# Patient Record
Sex: Female | Born: 2000 | Race: White | Hispanic: No | Marital: Single | Smoking: Never smoker
Health system: Southern US, Community
[De-identification: ages and names within clinical notes are randomized; demographics above are authoritative.]

## PROBLEM LIST (undated history)

## (undated) DIAGNOSIS — J45909 Unspecified asthma, uncomplicated: Secondary | ICD-10-CM

## (undated) DIAGNOSIS — J302 Other seasonal allergic rhinitis: Secondary | ICD-10-CM

## (undated) DIAGNOSIS — J039 Acute tonsillitis, unspecified: Secondary | ICD-10-CM

## (undated) HISTORY — PX: WISDOM TOOTH EXTRACTION: SHX21

## (undated) HISTORY — PX: TONSILLECTOMY: SUR1361

---
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
## 2022-02-08 ENCOUNTER — Other Ambulatory Visit (HOSPITAL_BASED_OUTPATIENT_CLINIC_OR_DEPARTMENT_OTHER): Payer: Self-pay | Admitting: Orthopaedic Surgery

## 2022-02-08 DIAGNOSIS — M84363A Stress fracture, right fibula, initial encounter for fracture: Secondary | ICD-10-CM

## 2022-02-25 ENCOUNTER — Ambulatory Visit (INDEPENDENT_AMBULATORY_CARE_PROVIDER_SITE_OTHER): Payer: PRIVATE HEALTH INSURANCE | Admitting: Podiatry

## 2022-02-25 ENCOUNTER — Ambulatory Visit (INDEPENDENT_AMBULATORY_CARE_PROVIDER_SITE_OTHER): Payer: PRIVATE HEALTH INSURANCE

## 2022-02-25 DIAGNOSIS — M79671 Pain in right foot: Secondary | ICD-10-CM

## 2022-02-25 DIAGNOSIS — M79672 Pain in left foot: Secondary | ICD-10-CM | POA: Diagnosis not present

## 2022-02-25 DIAGNOSIS — S86899A Other injury of other muscle(s) and tendon(s) at lower leg level, unspecified leg, initial encounter: Secondary | ICD-10-CM

## 2022-02-25 NOTE — Progress Notes (Signed)
   Chief Complaint  Patient presents with   Foot Injury    needs orthotics / hx of stress fracture last yr in left foot    HPI: 21 y.o. female presenting today as a new patient referral from Dr. Huel Cote for custom molded orthotics fittings.  Patient states that she has a history of shinsplints with stress fracture.  She is a Chemical engineer at [employer], volleyball.  The season has just recently started and she has began noticing recurrence of her shinsplints and tibial pain.  She presents for further treatment and evaluation  No past medical history on file.  No past surgical history on file.  No Known Allergies   Physical Exam: General: The patient is alert and oriented x3 in no acute distress.  Dermatology: Skin is warm, dry and supple bilateral lower extremities. Negative for open lesions or macerations.  Vascular: Palpable pedal pulses bilaterally. Capillary refill within normal limits.  Negative for any significant edema or erythema  Neurological: Light touch and protective threshold grossly intact  Musculoskeletal Exam: No pedal deformities noted  Radiographic Exam:  Normal osseous mineralization. Joint spaces preserved. No fracture/dislocation/boney destruction.  Otherwise normal exam  Assessment: 1.  Medial tibial stress syndrome left   Plan of Care:  1. Patient evaluated. X-Rays reviewed.  2.  Patient was molded today for custom molded orthotics 3.  Recommend good supportive shoes and sneakers 4.  Recommend rest ice compression and elevation as needed 5.  Continue close management with athletic trainers and Dr. Steward Drone.  As always thank you for the referral 6.  Return to clinic as needed      Felecia Shelling, DPM [facility]  Dr. Felecia Shelling, DPM    [address] [place], [place] [address]                Office [phone]  Fax [phone]

## 2022-02-25 NOTE — Progress Notes (Signed)
Dg  

## 2022-03-14 ENCOUNTER — Encounter: Payer: Self-pay | Admitting: Sports Medicine

## 2022-03-14 NOTE — Progress Notes (Signed)
  [employer] Training Room Note  History of Present Illness:   Jeanette Crosby is a 21 y.o. female who is a member of the [employer] team.  She presents her training room today for sustained sport related concussion on 03/04/2022.  Patient collided with another player and then likely the floor when she had her head hit. Both her and her trainer discussed she was dazed briefly but no LOC.  She has had symptoms of headache, actually had an episode of vomiting the following day as well as some dizziness and ocular motor disturbances that lasted through the week.  She has advanced to the return to play protocol with her athletic trainer, today was her first full day of practice and she had absolutely no recurrence of symptoms.  Occasionally she will have a headache but does have a notable history of both migraines and headaches.  History of 1 prior concussion, freshman year; symptoms lasted about 3-5 days History of migraines and headaches -uses triptan as needed, Tylenol History of GAD - BuSpar as needed No ADHD or learning disability  *We did independently review her scat as well as her impact testing with athletic trainer today.  Her most recent impact testing from 3 days ago revealed lower than baseline symptom scores: Baseline 21, impact scoring 16.   Assessment and Plan:   1.) Sport-related concussion - DOI: 03/04/2022.  Symptoms have resolved, currently on stage 5 of protocol with first full practice today without recurrence of symptoms.  May advance through return to play protocol, okay for graded return to game play tomorrow if asymptomatic for 24 hours from stage 5. 2.)  History of headaches and migraine headaches - controlled.  May use Tylenol as needed.  Did discuss with her if she is needing Tylenol regularly greater than 1 week, present for reevaluation and training room.   Comprehensive Musculoskeletal Exam:    Gen: Well-appearing, in no acute distress; non-toxic Resp: Breathing  unlabored on room air; no wheezing. Psych: Fluid speech in conversation; appropriate affect; normal thought process Neuro: Sensation intact throughout. No gross coordination deficits.  Cranial nerves II-XII grossly intact. PERLA.  No recurrence of symptoms or nystagmus with VOMS testing today.   Imaging:    N/a  Madelyn Brunner, DO Primary Care Sports Medicine - [facility] Team Physician [facility]  This document was dictated using Dragon voice recognition software. A reasonable attempt at proof reading has been made to minimize errors.

## 2022-03-19 ENCOUNTER — Telehealth: Payer: Self-pay | Admitting: Podiatry

## 2022-03-19 NOTE — Telephone Encounter (Signed)
Lvm for pt to contact office to sched appointment to [place].

## 2022-04-08 ENCOUNTER — Ambulatory Visit: Payer: No Typology Code available for payment source | Admitting: *Deleted

## 2022-04-08 DIAGNOSIS — S86899A Other injury of other muscle(s) and tendon(s) at lower leg level, unspecified leg, initial encounter: Secondary | ICD-10-CM

## 2022-04-08 NOTE — Progress Notes (Signed)
Patient presents today to pick up custom molded foot orthotics recommended by Dr. Amalia Hailey.   Orthotics were dispensed and fit was satisfactory. Reviewed instructions for break-in and wear. Written instructions given to patient.  Patient will follow up by Thursday of this week after practicing in them tomorrow and Wednesday. Patient stated that she has an away game on Friday but will come by to have PPT added in office if she still feels like she needs more support. She stated when she put them on that it felt a little hard but she is going to break them in and will come back in if needed.    [facility] - order # [id number]

## 2022-05-02 ENCOUNTER — Encounter: Payer: Self-pay | Admitting: Sports Medicine

## 2022-05-02 NOTE — Progress Notes (Signed)
  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  About 2 weeks ago she was going for a ball where she dove forward and the right knee buckled inward in a valgus fashion.  She felt a pop on the inner aspect of the knee.  She did have some swelling but tried icing and taking over-the-counter anti-inflammatories.  The knee has not gotten better and continues to feel unstable.  She has been forced to play in a hinged knee brace for any stability.  Out of the brace she reports sensation of giving out and hesitancy to twist on the knee with medial sided instability.  Denies any prior injury to this knee.   Assessment and Plan:   Right medial knee injury -high suspicion for medial meniscus tear or other intra-articular pathology.  Knee effusion on exam today and reports of instability.  Positive McMurray's, Thessaly's test.  She does have some laxity with Lachman test, although this is also present in the contralateral knee.  We will proceed with MRI without contrast of the right knee.  She is to remain in the hinged knee brace.  Discussed abstaining from practice, may try to play with a hinged knee brace in place as her pain allows.  Prescription for indomethacin sent.  Comprehensive Musculoskeletal Exam:    Right knee: There is a small-moderate effusion of the right knee today.  Positive TTP over the medial joint line.  There is pain with valgus stress without giving way of the MCL.  Positive McMurray's test, hesitancy the positive test of the Thessaly's test on the medial aspect.  There is some laxity with anterior drawer, although this is equivocal of the other knee as well with some generalized hypermobility.  Imaging:    - will order MRI w/o contrast of R-knee  Elba Barman, DO [facility] [place]  This  document was dictated using Dragon voice recognition software. A reasonable attempt at proof reading has been made to minimize errors.

## 2022-05-03 ENCOUNTER — Encounter: Payer: Self-pay | Admitting: Sports Medicine

## 2022-05-07 ENCOUNTER — Other Ambulatory Visit: Payer: Self-pay | Admitting: Sports Medicine

## 2022-05-07 DIAGNOSIS — S83206A Unspecified tear of unspecified meniscus, current injury, right knee, initial encounter: Secondary | ICD-10-CM

## 2022-05-07 DIAGNOSIS — M25461 Effusion, right knee: Secondary | ICD-10-CM

## 2022-05-07 DIAGNOSIS — S8990XA Unspecified injury of unspecified lower leg, initial encounter: Secondary | ICD-10-CM

## 2022-05-07 NOTE — Progress Notes (Signed)
Re-ordered knee MRI right for meniscal and internal pathology.   Elba Barman, DO Primary Care Sports Medicine Physician  [place]  This note was dictated using Dragon naturally speaking software and may contain errors in syntax, spelling, or content which have not been identified prior to signing this note.

## 2022-05-11 ENCOUNTER — Ambulatory Visit
Admission: RE | Admit: 2022-05-11 | Discharge: 2022-05-11 | Disposition: A | Discharge status: Home / Self Care | Payer: PRIVATE HEALTH INSURANCE | Source: Ambulatory Visit | Attending: Sports Medicine | Admitting: Sports Medicine

## 2022-05-11 DIAGNOSIS — M25461 Effusion, right knee: Secondary | ICD-10-CM

## 2022-05-11 DIAGNOSIS — S8990XA Unspecified injury of unspecified lower leg, initial encounter: Secondary | ICD-10-CM

## 2022-05-11 DIAGNOSIS — S83206A Unspecified tear of unspecified meniscus, current injury, right knee, initial encounter: Secondary | ICD-10-CM

## 2022-05-17 ENCOUNTER — Encounter: Payer: Self-pay | Admitting: Sports Medicine

## 2022-05-17 NOTE — Progress Notes (Signed)
  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  It has been almost a month since she had her injury where she dove forward for a ball and the right knee buckled in an inward valgus fashion.  She did feel a pop on the inner aspect of the knee.  She has been in the hinged knee brace.  Still using crutches intermittently to walk as she does feel some instability on the medial side of the knee.  Swelling has been improving.  Currently on indomethacin BID which does help but she still has a decent amount of pain on the medial side.  Assessment and Plan:   Right knee injury Right knee effusion - improving  The patient's mechanism and physical exam were fairly suggestive of medial meniscal injury, however her MRI did not show any meniscal involvement.  She is about a month out from her injury and still feeling sensation of instability and pain.  I would like to have her see my partner, Dr. Steward Drone, for a second opinion.  I am curious if she possibly has an occult tear of the meniscus that flips up and down and was laying flat during static MRI.  Always possible this could have been an MCL sprain but she never had any giving way or instability with testing during my examinations.  She will continue in the hinged knee brace indomethacin until her follow-up with Dr. Steward Drone.  Comprehensive Musculoskeletal Exam:    Right knee: Trace effusion.  Positive TTP on the medial joint line and medial femoral condyle.  There is pain and an intermittent clicking with McMurray's testing.  Pain and hesitancy with Thessaly's testing on the medial side.  No varus or valgus instability.  She is somewhat lax with both knees, however no difference in Lachman or drawer testing of the affected knee (negative).   Imaging:    MR Knee Right w/o contrast CLINICAL DATA:  Injured knee  playing volleyball 4 weeks ago.  EXAM: MRI OF THE RIGHT KNEE WITHOUT CONTRAST  TECHNIQUE: Multiplanar, multisequence MR imaging of the knee was performed. No intravenous contrast was administered.  COMPARISON:  None Available.  FINDINGS: MENISCI  Medial meniscus:  Intact  Lateral meniscus:  Intact  LIGAMENTS  Cruciates:  Intact  Collaterals:  Intact  CARTILAGE  Patellofemoral:  Normal  Medial:  Normal  Lateral:  Normal  Joint: No joint effusion. Slight thickening and inflammation of the quadriceps fat pad.  Popliteal Fossa:  No popliteal mass or Baker's cyst.  Extensor Mechanism: The patella retinacular structures are intact and the quadriceps and patellar tendons are intact.  Bones: Small contusion involving the fibular head. No other significant bony findings.  Other: Unremarkable knee musculature.  IMPRESSION: 1. Intact ligamentous structures. 2. No meniscal tears and intact articular cartilage. 3. Small bone contusion involving the fibular head. 4. No joint effusion or Baker's cyst. 5. Slight thickening and inflammation of the quadriceps fat pad.  Electronically Signed   By: Rudie Meyer M.D.   On: 05/11/2022 11:31    Madelyn Brunner, DO Primary Care Sports Medicine - [facility] Team Physician [facility]  This document was dictated using Dragon voice recognition software. A reasonable attempt at proof reading has been made to minimize errors.

## 2022-05-20 ENCOUNTER — Encounter (HOSPITAL_BASED_OUTPATIENT_CLINIC_OR_DEPARTMENT_OTHER): Payer: Self-pay | Admitting: Orthopaedic Surgery

## 2022-05-20 ENCOUNTER — Other Ambulatory Visit: Payer: Self-pay | Admitting: Sports Medicine

## 2022-05-20 NOTE — Progress Notes (Signed)
History of Present Illness:   Jeanette Crosby is a 21 y.o. female presents today with persistent right knee pain.  Of note she was previously seen by Dr. Madelyn Brunner after a traumatic injury where she felt the knee buckle while going for a ball.  She is Teacher, English as a foreign language.  She is here today for further discussion and assessment of her MRI.  She overall continues to have pain and swelling in the knee.  Her pain is predominantly medial.   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  I described that overall I do not see a meniscal tear in the area of her medial knee pain.  I did discuss differential diagnosis for sources of her knee pain and popping.  I did discuss that Hoffa's fat pad irritation as well as a medial plica could cause this.  To that effect I recommended ultrasound-guided knee injection as a next step to assist with her swelling and allow her to rehab and strengthen the knee.  I have advised that I do not see any ligamentous injury with regard to the knee and as result I do believe she would be able to rehab this quite well.  Comprehensive Musculoskeletal Exam:    Tenderness palpation about the medial joint space.  Positive McMurray, negative Lachman and posterior drawer.  No laxity with varus or valgus stress.  No tenderness about the lateral joint line or lateral ligamentous complexes  Imaging:    MRI right knee: Trace swelling within the knee although otherwise within normal limits  Huel Cote, MD Attending Physician, Orthopedic Surgery  This document was dictated using Dragon voice recognition software. A reasonable attempt at proof reading has been made to minimize errors.

## 2022-05-27 ENCOUNTER — Other Ambulatory Visit: Payer: Self-pay | Admitting: Sports Medicine

## 2022-05-27 ENCOUNTER — Ambulatory Visit (INDEPENDENT_AMBULATORY_CARE_PROVIDER_SITE_OTHER): Payer: PRIVATE HEALTH INSURANCE | Admitting: Orthopaedic Surgery

## 2022-05-27 DIAGNOSIS — M25561 Pain in right knee: Secondary | ICD-10-CM | POA: Diagnosis not present

## 2022-05-27 DIAGNOSIS — S8990XA Unspecified injury of unspecified lower leg, initial encounter: Secondary | ICD-10-CM

## 2022-05-27 DIAGNOSIS — S8991XA Unspecified injury of right lower leg, initial encounter: Secondary | ICD-10-CM | POA: Diagnosis not present

## 2022-05-27 MED ORDER — TRIAMCINOLONE ACETONIDE 40 MG/ML IJ SUSP
80.0000 mg | INTRAMUSCULAR | Status: AC | PRN
  Administered 2022-05-27: 80 mg via INTRA_ARTICULAR

## 2022-05-27 MED ORDER — LIDOCAINE HCL 1 % IJ SOLN
4.0000 mL | INTRAMUSCULAR | Status: AC | PRN
  Administered 2022-05-27: 4 mL

## 2022-05-27 NOTE — Progress Notes (Signed)
History of Present Illness:   05/27/2022: Presents today for right knee injection.  Jeanette Crosby is a 21 y.o. female presents today with persistent right knee pain.  Of note she was previously seen by Dr. Madelyn Brunner after a traumatic injury where she felt the knee buckle while going for a ball.  She is Teacher, English as a foreign language.  She is here today for further discussion and assessment of her MRI.  She overall continues to have pain and swelling in the knee.  Her pain is predominantly medial.   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  I described that overall I do not see a meniscal tear in the area of her medial knee pain.  I did discuss differential diagnosis for sources of her knee pain and popping.  I did discuss that Hoffa's fat pad irritation as well as a medial plica could cause this.  To that effect I recommended ultrasound-guided knee injection as a next step to assist with her swelling and allow her to rehab and strengthen the knee.  I have advised that I do not see any ligamentous injury with regard to the knee and as result I do believe she would be able to rehab this quite well.    Procedure Note  Patient: Jeanette Crosby             Date of Birth: 2001-05-07           MRN: [id number]             Visit Date: 05/27/2022  Procedures: Visit Diagnoses: No diagnosis found.  Large Joint Inj: R knee on 05/27/2022 11:04 AM Indications: pain Details: 22 G 1.5 in needle, ultrasound-guided anterior approach  Arthrogram: No  Medications: 4 mL lidocaine 1 %; 80 mg triamcinolone acetonide 40 MG/ML Outcome: tolerated well, no immediate complications Procedure, treatment alternatives, risks and benefits explained, specific risks discussed. Consent was given by the patient. Immediately prior to procedure a time out was called to verify the correct patient, procedure, equipment, support staff and site/side  marked as required. Patient was prepped and draped in the usual sterile fashion.       Comprehensive Musculoskeletal Exam:    Tenderness palpation about the medial joint space.  Positive McMurray, negative Lachman and posterior drawer.  No laxity with varus or valgus stress.  No tenderness about the lateral joint line or lateral ligamentous complexes  Imaging:    MRI right knee: Trace swelling within the knee although otherwise within normal limits  Huel Cote, MD Attending Physician, Orthopedic Surgery  This document was dictated using Dragon voice recognition software. A reasonable attempt at proof reading has been made to minimize errors.

## 2022-06-03 ENCOUNTER — Other Ambulatory Visit: Payer: Self-pay | Admitting: Sports Medicine

## 2022-06-10 ENCOUNTER — Other Ambulatory Visit: Payer: Self-pay | Admitting: Sports Medicine

## 2022-06-17 ENCOUNTER — Other Ambulatory Visit: Payer: Self-pay | Admitting: Sports Medicine

## 2022-07-12 ENCOUNTER — Ambulatory Visit (HOSPITAL_BASED_OUTPATIENT_CLINIC_OR_DEPARTMENT_OTHER): Payer: Self-pay | Admitting: Orthopaedic Surgery

## 2022-07-12 ENCOUNTER — Ambulatory Visit (INDEPENDENT_AMBULATORY_CARE_PROVIDER_SITE_OTHER): Payer: PRIVATE HEALTH INSURANCE | Admitting: Orthopaedic Surgery

## 2022-07-12 DIAGNOSIS — S83261A Peripheral tear of lateral meniscus, current injury, right knee, initial encounter: Secondary | ICD-10-CM | POA: Diagnosis not present

## 2022-07-12 NOTE — Progress Notes (Signed)
History of Present Illness:   07/12/2022: Once today she did not get any relief from her right knee injection.  She is overall continuing to have pain and mechanical type symptoms with popping and clicking in a deep squat.  She does feel like her knee is continuing to buckle.  She is having a hard time strengthening of the knee.  She is experiencing medial as well as lateral joint line tenderness and pain  Jeanette Crosby is a 22 y.o. female presents today with persistent right knee pain.  Of note she was previously seen by Dr. Elba Barman after a traumatic injury where she felt the knee buckle while going for a ball.  She is Animator.  She is here today for further discussion and assessment of her MRI.  She overall continues to have pain and swelling in the knee.  Her pain is predominantly medial.   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  At this point she is having an extremely difficult time rehabbing with bruising of the knee and persistent pain.  I did discuss that on review of her MRI I do see a a posterior lateral minimally displaced meniscal horizontal cleavage tear which I do believe could be contributing to these findings.  I did discuss that ultimately diagnostic arthroscopy would be the only way to definitively diagnose this.  Overall she has trialed physical therapy and rehab for the last 6 weeks and is continuing to experience mechanical and painful symptoms.  At this time given the fact that she is not able to improve I do believe diagnostic arthroscopy with possible lateral meniscal repair is indicated.  I did discuss possible weightbearing restrictions following surgery if a repair is needed.  We did go over this in detail.  I did discuss alternative treatment options with her including continued quadricep strengthening as well as possible PRP injection.  She would like to defer this today  and keep PRP in her back pocket as needed.  Will plan to proceed with diagnostic arthroscopy  Plan for right knee diagnostic arthroscopy with possible lateral meniscal repair   Comprehensive Musculoskeletal Exam:    Tenderness palpation about the medial joint space as well as lateral.  Positive McMurray, negative Lachman and posterior drawer.  No laxity with varus or valgus stress.  No tenderness about the lateral joint line or lateral ligamentous complexes  Imaging:    MRI right knee: Trace swelling with a possible cleavage tear involving the posterior third lateral meniscus  Jeanette Mulders, MD Attending Physician, Orthopedic Surgery  This document was dictated using Dragon voice recognition software. A reasonable attempt at proof reading has been made to minimize errors.

## 2022-07-12 NOTE — H&P (View-Only) (Signed)
History of Present Illness:   07/12/2022: Once today she did not get any relief from her right knee injection.  She is overall continuing to have pain and mechanical type symptoms with popping and clicking in a deep squat.  She does feel like her knee is continuing to buckle.  She is having a hard time strengthening of the knee.  She is experiencing medial as well as lateral joint line tenderness and pain  Jeanette Crosby is a 21 y.o. female presents today with persistent right knee pain.  Of note she was previously seen by Dr. Dana Crosby after a traumatic injury where she felt the knee buckle while going for a ball.  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  She is here today for further discussion and assessment of her MRI.  She overall continues to have pain and swelling in the knee.  Her pain is predominantly medial.   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  At this point she is having an extremely difficult time rehabbing with bruising of the knee and persistent pain.  I did discuss that on review of her MRI I do see a a posterior lateral minimally displaced meniscal horizontal cleavage tear which I do believe could be contributing to these findings.  I did discuss that ultimately diagnostic arthroscopy would be the only way to definitively diagnose this.  Overall she has trialed physical therapy and rehab for the last 6 weeks and is continuing to experience mechanical and painful symptoms.  At this time given the fact that she is not able to improve I do believe diagnostic arthroscopy with possible lateral meniscal repair is indicated.  I did discuss possible weightbearing restrictions following surgery if a repair is needed.  We did go over this in detail.  I did discuss alternative treatment options with her including continued quadricep strengthening as well as possible PRP injection.  She would like to defer this today  and keep PRP in her back pocket as needed.  Will plan to proceed with diagnostic arthroscopy  Plan for right knee diagnostic arthroscopy with possible lateral meniscal repair   Comprehensive Musculoskeletal Exam:    Tenderness palpation about the medial joint space as well as lateral.  Positive McMurray, negative Lachman and posterior drawer.  No laxity with varus or valgus stress.  No tenderness about the lateral joint line or lateral ligamentous complexes  Imaging:    MRI right knee: Trace swelling with a possible cleavage tear involving the posterior third lateral meniscus  Jeanette Dai, MD Attending Physician, Orthopedic Surgery  This document was dictated using Dragon voice recognition software. A reasonable attempt at proof reading has been made to minimize errors. 

## 2022-07-17 ENCOUNTER — Encounter (HOSPITAL_BASED_OUTPATIENT_CLINIC_OR_DEPARTMENT_OTHER): Payer: Self-pay | Admitting: Orthopaedic Surgery

## 2022-07-17 ENCOUNTER — Other Ambulatory Visit: Payer: Self-pay

## 2022-07-19 NOTE — Progress Notes (Signed)

## 2022-07-22 ENCOUNTER — Ambulatory Visit (HOSPITAL_BASED_OUTPATIENT_CLINIC_OR_DEPARTMENT_OTHER): Payer: PRIVATE HEALTH INSURANCE | Admitting: Anesthesiology

## 2022-07-22 ENCOUNTER — Encounter (HOSPITAL_BASED_OUTPATIENT_CLINIC_OR_DEPARTMENT_OTHER): Payer: Self-pay | Admitting: Orthopaedic Surgery

## 2022-07-22 ENCOUNTER — Encounter (HOSPITAL_BASED_OUTPATIENT_CLINIC_OR_DEPARTMENT_OTHER)
Admission: RE | Disposition: A | Discharge status: Home / Self Care | Payer: Self-pay | Source: Ambulatory Visit | Attending: Orthopaedic Surgery

## 2022-07-22 ENCOUNTER — Ambulatory Visit (HOSPITAL_BASED_OUTPATIENT_CLINIC_OR_DEPARTMENT_OTHER)
Admission: RE | Admit: 2022-07-22 | Discharge: 2022-07-22 | Disposition: A | Discharge status: Home / Self Care | Payer: PRIVATE HEALTH INSURANCE | Source: Ambulatory Visit | Attending: Orthopaedic Surgery | Admitting: Orthopaedic Surgery

## 2022-07-22 ENCOUNTER — Other Ambulatory Visit: Payer: Self-pay

## 2022-07-22 DIAGNOSIS — S83241A Other tear of medial meniscus, current injury, right knee, initial encounter: Secondary | ICD-10-CM | POA: Diagnosis present

## 2022-07-22 DIAGNOSIS — M6751 Plica syndrome, right knee: Secondary | ICD-10-CM

## 2022-07-22 DIAGNOSIS — S83261A Peripheral tear of lateral meniscus, current injury, right knee, initial encounter: Secondary | ICD-10-CM

## 2022-07-22 DIAGNOSIS — Y9368 Activity, volleyball (beach) (court): Secondary | ICD-10-CM | POA: Diagnosis not present

## 2022-07-22 DIAGNOSIS — Z01818 Encounter for other preprocedural examination: Secondary | ICD-10-CM

## 2022-07-22 DIAGNOSIS — S83241D Other tear of medial meniscus, current injury, right knee, subsequent encounter: Secondary | ICD-10-CM | POA: Diagnosis not present

## 2022-07-22 HISTORY — DX: Other seasonal allergic rhinitis: J30.2

## 2022-07-22 HISTORY — DX: Unspecified asthma, uncomplicated: J45.909

## 2022-07-22 HISTORY — DX: Acute tonsillitis, unspecified: J03.90

## 2022-07-22 HISTORY — PX: KNEE ARTHROSCOPY WITH MENISCAL REPAIR: SHX5653

## 2022-07-22 LAB — POCT PREGNANCY, URINE: Preg Test, Ur: NEGATIVE

## 2022-07-22 SURGERY — ARTHROSCOPY, KNEE, WITH MENISCUS REPAIR
Anesthesia: General | Site: Knee | Laterality: Right

## 2022-07-22 MED ORDER — KETOROLAC TROMETHAMINE 30 MG/ML IJ SOLN
INTRAMUSCULAR | Status: AC
  Filled 2022-07-22: qty 1

## 2022-07-22 MED ORDER — ONDANSETRON HCL 4 MG/2ML IJ SOLN
INTRAMUSCULAR | Status: AC
  Filled 2022-07-22: qty 2

## 2022-07-22 MED ORDER — OXYCODONE HCL 5 MG PO TABS
ORAL_TABLET | ORAL | Status: AC
  Filled 2022-07-22: qty 1

## 2022-07-22 MED ORDER — GABAPENTIN 300 MG PO CAPS
300.0000 mg | ORAL_CAPSULE | Freq: Once | ORAL | Status: AC
  Administered 2022-07-22: 300 mg via ORAL

## 2022-07-22 MED ORDER — ACETAMINOPHEN 500 MG PO TABS
ORAL_TABLET | ORAL | Status: AC
  Filled 2022-07-22: qty 2

## 2022-07-22 MED ORDER — FENTANYL CITRATE (PF) 100 MCG/2ML IJ SOLN
INTRAMUSCULAR | Status: AC
  Filled 2022-07-22: qty 2

## 2022-07-22 MED ORDER — EPINEPHRINE PF 1 MG/ML IJ SOLN
INTRAMUSCULAR | Status: AC
  Filled 2022-07-22: qty 2

## 2022-07-22 MED ORDER — ACETAMINOPHEN 500 MG PO TABS: 500.0000 mg | ORAL_TABLET | Freq: Three times a day (TID) | ORAL | 0 refills | Status: AC

## 2022-07-22 MED ORDER — ONDANSETRON HCL 4 MG/2ML IJ SOLN
INTRAMUSCULAR | Status: DC | PRN
  Administered 2022-07-22: 4 mg via INTRAVENOUS

## 2022-07-22 MED ORDER — DEXAMETHASONE SODIUM PHOSPHATE 4 MG/ML IJ SOLN
INTRAMUSCULAR | Status: DC | PRN
  Administered 2022-07-22: 10 mg via INTRAVENOUS

## 2022-07-22 MED ORDER — LIDOCAINE HCL (CARDIAC) PF 100 MG/5ML IV SOSY
PREFILLED_SYRINGE | INTRAVENOUS | Status: DC | PRN
  Administered 2022-07-22: 40 mg via INTRAVENOUS

## 2022-07-22 MED ORDER — FENTANYL CITRATE (PF) 100 MCG/2ML IJ SOLN
INTRAMUSCULAR | Status: DC | PRN
  Administered 2022-07-22 (×2): 50 ug via INTRAVENOUS

## 2022-07-22 MED ORDER — ACETAMINOPHEN 500 MG PO TABS
1000.0000 mg | ORAL_TABLET | Freq: Once | ORAL | Status: AC
  Administered 2022-07-22: 1000 mg via ORAL

## 2022-07-22 MED ORDER — PROPOFOL 10 MG/ML IV BOLUS
INTRAVENOUS | Status: DC | PRN
  Administered 2022-07-22: 150 mg via INTRAVENOUS
  Administered 2022-07-22: 20 mg via INTRAVENOUS

## 2022-07-22 MED ORDER — TRANEXAMIC ACID-NACL 1000-0.7 MG/100ML-% IV SOLN
1000.0000 mg | INTRAVENOUS | Status: AC
  Administered 2022-07-22: 1000 mg via INTRAVENOUS

## 2022-07-22 MED ORDER — CEFAZOLIN SODIUM-DEXTROSE 2-4 GM/100ML-% IV SOLN
2.0000 g | INTRAVENOUS | Status: AC
  Administered 2022-07-22: 2 g via INTRAVENOUS

## 2022-07-22 MED ORDER — AMISULPRIDE (ANTIEMETIC) 5 MG/2ML IV SOLN
10.0000 mg | Freq: Once | INTRAVENOUS | Status: AC
  Administered 2022-07-22: 10 mg via INTRAVENOUS

## 2022-07-22 MED ORDER — OXYCODONE HCL 5 MG/5ML PO SOLN: 5.0000 mg | Freq: Once | ORAL | Status: AC | PRN

## 2022-07-22 MED ORDER — ACETAMINOPHEN 10 MG/ML IV SOLN: 1000.0000 mg | Freq: Once | INTRAVENOUS | Status: DC | PRN

## 2022-07-22 MED ORDER — KETOROLAC TROMETHAMINE 30 MG/ML IJ SOLN
INTRAMUSCULAR | Status: DC | PRN
  Administered 2022-07-22: 30 mg via INTRAVENOUS

## 2022-07-22 MED ORDER — EPINEPHRINE PF 1 MG/ML IJ SOLN
INTRAMUSCULAR | Status: AC
  Filled 2022-07-22: qty 1

## 2022-07-22 MED ORDER — LIDOCAINE 2% (20 MG/ML) 5 ML SYRINGE
INTRAMUSCULAR | Status: AC
  Filled 2022-07-22: qty 5

## 2022-07-22 MED ORDER — CEFAZOLIN SODIUM-DEXTROSE 2-4 GM/100ML-% IV SOLN
INTRAVENOUS | Status: AC
  Filled 2022-07-22: qty 100

## 2022-07-22 MED ORDER — BUPIVACAINE HCL (PF) 0.25 % IJ SOLN
INTRAMUSCULAR | Status: DC | PRN
  Administered 2022-07-22: 30 mL

## 2022-07-22 MED ORDER — LACTATED RINGERS IV SOLN: INTRAVENOUS | Status: DC

## 2022-07-22 MED ORDER — OXYCODONE HCL 5 MG PO TABS: 5.0000 mg | ORAL_TABLET | ORAL | 0 refills | Status: AC | PRN

## 2022-07-22 MED ORDER — DEXAMETHASONE SODIUM PHOSPHATE 10 MG/ML IJ SOLN
INTRAMUSCULAR | Status: AC
  Filled 2022-07-22: qty 1

## 2022-07-22 MED ORDER — OXYCODONE HCL 5 MG PO TABS
5.0000 mg | ORAL_TABLET | Freq: Once | ORAL | Status: AC | PRN
  Administered 2022-07-22: 5 mg via ORAL

## 2022-07-22 MED ORDER — MIDAZOLAM HCL 2 MG/2ML IJ SOLN
INTRAMUSCULAR | Status: AC
  Filled 2022-07-22: qty 2

## 2022-07-22 MED ORDER — TRANEXAMIC ACID-NACL 1000-0.7 MG/100ML-% IV SOLN
INTRAVENOUS | Status: AC
  Filled 2022-07-22: qty 100

## 2022-07-22 MED ORDER — DEXMEDETOMIDINE HCL IN NACL 80 MCG/20ML IV SOLN
INTRAVENOUS | Status: AC
  Filled 2022-07-22: qty 20

## 2022-07-22 MED ORDER — ACETAMINOPHEN 160 MG/5ML PO SOLN: 1000.0000 mg | Freq: Once | ORAL | Status: DC | PRN

## 2022-07-22 MED ORDER — FENTANYL CITRATE (PF) 100 MCG/2ML IJ SOLN
25.0000 ug | INTRAMUSCULAR | Status: DC | PRN
  Administered 2022-07-22 (×2): 25 ug via INTRAVENOUS

## 2022-07-22 MED ORDER — BUPIVACAINE HCL (PF) 0.25 % IJ SOLN
INTRAMUSCULAR | Status: AC
  Filled 2022-07-22: qty 30

## 2022-07-22 MED ORDER — IBUPROFEN 800 MG PO TABS: 800.0000 mg | ORAL_TABLET | Freq: Three times a day (TID) | ORAL | 0 refills | Status: AC

## 2022-07-22 MED ORDER — GABAPENTIN 300 MG PO CAPS
ORAL_CAPSULE | ORAL | Status: AC
  Filled 2022-07-22: qty 1

## 2022-07-22 MED ORDER — AMISULPRIDE (ANTIEMETIC) 5 MG/2ML IV SOLN
INTRAVENOUS | Status: AC
  Filled 2022-07-22: qty 4

## 2022-07-22 MED ORDER — ACETAMINOPHEN 500 MG PO TABS: 1000.0000 mg | ORAL_TABLET | Freq: Once | ORAL | Status: DC | PRN

## 2022-07-22 MED ORDER — ASPIRIN 325 MG PO TBEC: 325.0000 mg | DELAYED_RELEASE_TABLET | Freq: Every day | ORAL | 0 refills | Status: AC

## 2022-07-22 MED ORDER — MIDAZOLAM HCL 5 MG/5ML IJ SOLN
INTRAMUSCULAR | Status: DC | PRN
  Administered 2022-07-22: 2 mg via INTRAVENOUS

## 2022-07-22 SURGICAL SUPPLY — 62 items
APL PRP STRL LF DISP 70% ISPRP (MISCELLANEOUS) ×1
BANDAGE ESMARK 6X9 LF (GAUZE/BANDAGES/DRESSINGS) IMPLANT
BLADE EXCALIBUR 4.0X13 (MISCELLANEOUS) IMPLANT
BLADE SURG 15 STRL LF DISP TIS (BLADE) IMPLANT
BLADE SURG 15 STRL SS (BLADE)
BNDG CMPR 9X6 STRL LF SNTH (GAUZE/BANDAGES/DRESSINGS)
BNDG ELASTIC 4X5.8 VLCR STR LF (GAUZE/BANDAGES/DRESSINGS) ×1 IMPLANT
BNDG ELASTIC 6X5.8 VLCR STR LF (GAUZE/BANDAGES/DRESSINGS) ×1 IMPLANT
BNDG ESMARK 6X9 LF (GAUZE/BANDAGES/DRESSINGS)
CHLORAPREP W/TINT 26 (MISCELLANEOUS) ×1 IMPLANT
COOLER ICEMAN CLASSIC (MISCELLANEOUS) ×1 IMPLANT
CUFF TOURN SGL QUICK 34 (TOURNIQUET CUFF)
CUFF TRNQT CYL 34X4.125X (TOURNIQUET CUFF) IMPLANT
DISSECTOR  3.8MM X 13CM (MISCELLANEOUS) ×1
DISSECTOR 3.8MM X 13CM (MISCELLANEOUS) ×1 IMPLANT
DRAPE ARTHROSCOPY W/POUCH 90 (DRAPES) ×1 IMPLANT
DRAPE IMP U-DRAPE 54X76 (DRAPES) ×1 IMPLANT
DRAPE INCISE IOBAN 66X45 STRL (DRAPES) IMPLANT
DRAPE U-SHAPE 47X51 STRL (DRAPES) ×1 IMPLANT
DRSG TEGADERM 4X4.75 (GAUZE/BANDAGES/DRESSINGS) IMPLANT
DW OUTFLOW CASSETTE/TUBE SET (MISCELLANEOUS) ×1 IMPLANT
ELECT REM PT RETURN 9FT ADLT (ELECTROSURGICAL)
ELECTRODE REM PT RTRN 9FT ADLT (ELECTROSURGICAL) IMPLANT
EXCALIBUR 3.8MM X 13CM (MISCELLANEOUS) IMPLANT
FASTFIX FLEX CRVD INSERT CANN (Miscellaneous) ×2 IMPLANT
GAUZE 4X4 16PLY ~~LOC~~+RFID DBL (SPONGE) IMPLANT
GAUZE PAD ABD 8X10 STRL (GAUZE/BANDAGES/DRESSINGS) ×1 IMPLANT
GAUZE SPONGE 4X4 12PLY STRL (GAUZE/BANDAGES/DRESSINGS) ×1 IMPLANT
GAUZE XEROFORM 1X8 LF (GAUZE/BANDAGES/DRESSINGS) ×1 IMPLANT
GLOVE BIO SURGEON STRL SZ 6 (GLOVE) ×1 IMPLANT
GLOVE BIO SURGEON STRL SZ7.5 (GLOVE) ×1 IMPLANT
GLOVE BIOGEL PI IND STRL 6.5 (GLOVE) ×1 IMPLANT
GLOVE BIOGEL PI IND STRL 7.0 (GLOVE) IMPLANT
GLOVE BIOGEL PI IND STRL 8 (GLOVE) ×1 IMPLANT
GOWN STRL REUS W/ TWL LRG LVL3 (GOWN DISPOSABLE) ×1 IMPLANT
GOWN STRL REUS W/ TWL XL LVL3 (GOWN DISPOSABLE) ×1 IMPLANT
GOWN STRL REUS W/TWL LRG LVL3 (GOWN DISPOSABLE) ×1
GOWN STRL REUS W/TWL XL LVL3 (GOWN DISPOSABLE) ×1
INSERT FASTFIX FLEX CRVD CANN (Miscellaneous) IMPLANT
LOOP 2 FIBERLINK CLOSED (SUTURE) IMPLANT
MANAGER SUT NOVOCUT (CUTTER) IMPLANT
MANIFOLD NEPTUNE II (INSTRUMENTS) ×1 IMPLANT
NDL HYPO 18GX1.5 BLUNT FILL (NEEDLE) ×1 IMPLANT
NDL SAFETY ECLIP 18X1.5 (MISCELLANEOUS) ×1 IMPLANT
NDL SUT 2-0 SCORPION KNEE (NEEDLE) IMPLANT
NEEDLE HYPO 18GX1.5 BLUNT FILL (NEEDLE) ×1 IMPLANT
NEEDLE SUT 2-0 SCORPION KNEE (NEEDLE) IMPLANT
PACK ARTHROSCOPY DSU (CUSTOM PROCEDURE TRAY) ×1 IMPLANT
PACK BASIN DAY SURGERY FS (CUSTOM PROCEDURE TRAY) ×1 IMPLANT
PAD COLD SHLDR WRAP-ON (PAD) ×1 IMPLANT
PADDING CAST COTTON 6X4 STRL (CAST SUPPLIES) ×1 IMPLANT
PENCIL SMOKE EVACUATOR (MISCELLANEOUS) IMPLANT
PORT APPOLLO RF 90DEGREE MULTI (SURGICAL WAND) ×1 IMPLANT
SLEEVE SCD COMPRESS KNEE MED (STOCKING) ×1 IMPLANT
SUT ETHILON 3 0 PS 1 (SUTURE) ×1 IMPLANT
SUT VIC AB 2-0 SH 27 (SUTURE)
SUT VIC AB 2-0 SH 27XBRD (SUTURE) IMPLANT
SUT VIC AB 3-0 FS2 27 (SUTURE) IMPLANT
SYR 5ML LL (SYRINGE) ×1 IMPLANT
TOWEL GREEN STERILE FF (TOWEL DISPOSABLE) ×2 IMPLANT
TRAY DSU PREP LF (CUSTOM PROCEDURE TRAY) ×1 IMPLANT
TUBING ARTHROSCOPY IRRIG 16FT (MISCELLANEOUS) ×1 IMPLANT

## 2022-07-22 NOTE — Op Note (Addendum)
Date of Surgery: 07/22/2022  INDICATIONS: Ms. Inlow is a 22 y.o.-year-old female with a right knee medial meniscal tear as well as symptomatic plica.  The risk and benefits of the procedure were discussed in detail and documented in the pre-operative evaluation.   PREOPERATIVE DIAGNOSIS: 1.  Right knee medial meniscal tear  POSTOPERATIVE DIAGNOSIS: Same.  PROCEDURE: 1.  Right knee medial meniscal repair 2.  Right knee limited synovectomy of plica  SURGEON: Yevonne Pax MD  ASSISTANT: Raynelle Fanning, ATC  ANESTHESIA:  general  IV FLUIDS AND URINE: See anesthesia record.  ANTIBIOTICS: Ancef  ESTIMATED BLOOD LOSS: 5 mL.  IMPLANTS:  Implant Name Type Inv. Item Serial No. Manufacturer Lot No. LRB No. Used Action  FASTFIX FLEX CRVD Daphine Deutscher - [id number] Miscellaneous FASTFIX FLEX CRVD [employer] [id number] Right 1 Implanted  FASTFIX FLEX CRVD INSERT CANN - [id number] Miscellaneous FASTFIX FLEX CRVD INSERT CANN  [employer] [id number] Right 1 Implanted    DRAINS: None  CULTURES: None  COMPLICATIONS: none  DESCRIPTION OF PROCEDURE:  Examination under anesthesia: A careful examination under anesthesia was performed.  Knee ROM motion was: -5-135 Lachman: Normal Pivot Shift: Normal Posterior drawer: normal.   Varus stability in full extension: normal.   Varus stability in 30 degrees of flexion: normal.  Valgus stability in full extension: normal.   Valgus stability in 30 degrees of flexion: normal.  Posterolateral drawer: normal   Intra-operative findings: A thorough arthroscopic examination of the knee was performed.  The findings are: 1. Suprapatellar pouch: Normal 2. Undersurface of median ridge: Normal 3. Medial patellar facet: Normal 4. Lateral patellar facet: Normal 5. Trochlea: Normal 6. Lateral gutter/popliteus tendon: Normal 7. Hoffa's fat pad: Normal 8. Medial gutter/plica: Hypertrophied medial plica with cartilage  grade 1 thinning over the underlying 9. ACL: Normal 10. PCL: Normal 11. Medial meniscus: Small horizontal tear in the red red zone of the mid body posterior third junction 12. Medial compartment cartilage: Normal 13. Lateral meniscus: Normal 14. Lateral compartment cartilage: Normal   I identified the patient in the pre-operative holding area.  I marked the operative knee with my initials. I reviewed the risks and benefits of the proposed surgical intervention and the patient wished to proceed.  Anesthesia performed a peripheral nerve block.  Patient was subsequently taken back to the operating room.  The patient was transferred to the operative suite and placed in the supine position with all bony prominences padded.     SCDs were placed on the non-operative lower extremity. Appropriate antibiotics was administered within 1 hour before incision. The operative lower extremity was then prepped and draped in standard fashion. A time out was performed confirming the correct extremity, correct patient and correct procedure.    A standard anterolateral portal was made with an 11 blade.  The ideal position for the anteromedial portal was established using a spinal needle.  This AM portal was then created under direct visualization with an 11 blade.  A full diagnostic arthroscopy was then performed, as described above, including probing of the chondral and meniscal surfaces.     The meniscus repair was then performed.  A ball rasp was introduced into the horizontal cleavage component of the medial meniscal tear.  A [employer] FasT-Fix was then used a total of twice to connect the peripheral component of the tear and a hay bail fashion with the superior portion of the leaflet of the meniscus.  This was done  a total of twice.  Medial plica excision was then performed with debridement and combination of a shaver as well as electrocautery. The medial hoffa pad as well as the ligamentum mucosum was debrided  gently.  That concluded the case.  Skin was closed with 2-0 Vicryl and 3-0 nylon. Xeroform gauze, gauze, Tegaderm, Iceman and brace were applied.  Instrument, sponge, and needle counts were correct prior to wound closure and at the conclusion of the case.  The patient was taken to the PACU without complication     POSTOPERATIVE PLAN: She will be nonweightbearing for a total of 2 weeks.  She will be range of motion as tolerated.  I will plan to see her back in 2 weeks for suture removal.  At that time she will begin range of motion activity as tolerated.  Yevonne Pax, MD 12:15 PM

## 2022-07-22 NOTE — Brief Op Note (Signed)
   Brief Op Note  Date of Surgery: 07/22/2022  Preoperative Diagnosis: RIGHT LATERAL MENISCUS TEAR  Postoperative Diagnosis: same  Procedure: Procedure(s): RIGHT KNEE ARTHROSCOPY WITH POSSIBLE LATERAL MENISCAL REPAIR  Implants: Implant Name Type Inv. Item Serial No. Manufacturer Lot No. LRB No. Used Action  FASTFIX FLEX CRVD Daphine Deutscher - [id number] Miscellaneous FASTFIX FLEX CRVD [employer] [id number] Right 1 Implanted  FASTFIX FLEX CRVD Daphine Deutscher - [id number] Miscellaneous FASTFIX FLEX CRVD INSERT Jasper [employer] [id number] Right 1 Implanted    Surgeons: Surgeon(s): Vanetta Mulders, MD  Anesthesia: Regional    Estimated Blood Loss: See anesthesia record  Complications: None  Condition to PACU: Stable  Yevonne Pax, MD 07/22/2022 12:15 PM

## 2022-07-22 NOTE — Discharge Instructions (Addendum)
Discharge Instructions    Attending Surgeon: Vanetta Mulders, MD Office Phone Number: [phone]   Diagnosis and Procedures:    Surgeries Performed: Right knee medial meniscal repair, plica excision  Discharge Plan:    Diet: Resume usual diet. Begin with light or bland foods.  Drink plenty of fluids.  Activity:  Non weight bear for 2 weeks total. You are advised to go home directly from the hospital or surgical center. Restrict your activities.  GENERAL INSTRUCTIONS: 1.  Please apply ice to your wound to help with swelling and inflammation. This will improve your comfort and your overall recovery following surgery.     2. Please call Dr. Eddie Dibbles office at [phone] with questions Monday-Friday during business hours. If no one answers, please leave a message and someone should get back to the patient within 24 hours. For emergencies please call 911 or proceed to the emergency room.   3. Patient to notify surgical team if experiences any of the following: Bowel/Bladder dysfunction, uncontrolled pain, nerve/muscle weakness, incision with increased drainage or redness, nausea/vomiting and Fever greater than 101.0 F.  Be alert for signs of infection including redness, streaking, odor, fever or chills. Be alert for excessive pain or bleeding and notify your surgeon immediately.  WOUND INSTRUCTIONS:   Leave your dressing, cast, or splint in place until your post operative visit.  Keep it clean and dry.  Always keep the incision clean and dry until the staples/sutures are removed. If there is no drainage from the incision you should keep it open to air. If there is drainage from the incision you must keep it covered at all times until the drainage stops  Do not soak in a bath tub, hot tub, pool, lake or other body of water until 21 days after your surgery and your incision is completely dry and healed.  If you have removable sutures (or staples) they must be removed 10-14  days (unless otherwise instructed) from the day of your surgery.     1)  Elevate the extremity as much as possible.  2)  Keep the dressing clean and dry.  3)  Please call us if the dressing becomes wet or dirty.  4)  If you are experiencing worsening pain or worsening swelling, please call.     MEDICATIONS: Resume all previous home medications at the previous prescribed dose and frequency unless otherwise noted Start taking the  pain medications on an as-needed basis as prescribed  Please taper down pain medication over the next week following surgery.  Ideally you should not require a refill of any narcotic pain medication.  Take pain medication with food to minimize nausea. In addition to the prescribed pain medication, you may take over-the-counter pain relievers such as Tylenol.  Do NOT take additional tylenol if your pain medication already has tylenol in it.  Aspirin 325mg  daily for four weeks.      FOLLOWUP INSTRUCTIONS: 1. Follow up at the Physical Therapy Clinic 3-4 days following surgery. This appointment should be scheduled unless other arrangements have been made.The Physical Therapy scheduling number is [phone] if an appointment has not already been arranged.  2. Contact Dr. Eddie Dibbles office during office hours at [phone] or the practice after hours line at [phone] for non-emergencies. For medical emergencies call 911.   Discharge Location: Home    Post Anesthesia Home Care Instructions  Activity: Get plenty of rest for the remainder of the day. A responsible individual must stay with you for  24 hours following the procedure.  For the next 24 hours, DO NOT: -Drive a car -Paediatric nurse -Drink alcoholic beverages -Take any medication unless instructed by your physician -Make any legal decisions or sign important papers.  Meals: Start with liquid foods such as gelatin or soup. Progress to regular foods as tolerated. Avoid greasy, spicy, heavy  foods. If nausea and/or vomiting occur, drink only clear liquids until the nausea and/or vomiting subsides. Call your physician if vomiting continues.  Special Instructions/Symptoms: Your throat may feel dry or sore from the anesthesia or the breathing tube placed in your throat during surgery. If this causes discomfort, gargle with warm salt water. The discomfort should disappear within 24 hours.  If you had a scopolamine patch placed behind your ear for the management of post- operative nausea and/or vomiting:  1. The medication in the patch is effective for 72 hours, after which it should be removed.  Wrap patch in a tissue and discard in the trash. Wash hands thoroughly with soap and water. 2. You may remove the patch earlier than 72 hours if you experience unpleasant side effects which may include dry mouth, dizziness or visual disturbances. 3. Avoid touching the patch. Wash your hands with soap and water after contact with the patch.  No tylenol until after 4:15 if needed today.  No ibuprofen until after 6pm if needed today.

## 2022-07-22 NOTE — Anesthesia Procedure Notes (Signed)
Procedure Name: LMA Insertion Date/Time: 07/22/2022 11:54 AM  Performed by: Tawni Millers, CRNAPre-anesthesia Checklist: Patient identified, Emergency Drugs available, Suction available and Patient being monitored Patient Re-evaluated:Patient Re-evaluated prior to induction Oxygen Delivery Method: Circle system utilized Preoxygenation: Pre-oxygenation with 100% oxygen Induction Type: IV induction Ventilation: Mask ventilation without difficulty LMA: LMA inserted LMA Size: 4.0 Number of attempts: 1 Airway Equipment and Method: Bite block Placement Confirmation: positive ETCO2 Tube secured with: Tape Dental Injury: Teeth and Oropharynx as per pre-operative assessment

## 2022-07-22 NOTE — Anesthesia Postprocedure Evaluation (Signed)
Anesthesia Post Note  Patient: Jeanette Crosby  Procedure(s) Performed: RIGHT KNEE ARTHROSCOPY WITH POSSIBLE MEDIAL MENISCAL REPAIR, PLICA EXCISION (Right: Knee)     Patient location during evaluation: PACU Anesthesia Type: General Level of consciousness: awake and alert Pain management: pain level controlled Vital Signs Assessment: post-procedure vital signs reviewed and stable Respiratory status: spontaneous breathing, nonlabored ventilation and respiratory function stable Cardiovascular status: blood pressure returned to baseline and stable Postop Assessment: no apparent nausea or vomiting Anesthetic complications: no   No notable events documented.  Last Vitals:  Vitals:   07/22/22 1245 07/22/22 1300  BP: 126/82 122/85  Pulse: (!) 55 62  Resp: 18 14  Temp:    SpO2: 100% 98%    Last Pain:  Vitals:   07/22/22 1300  TempSrc:   PainSc: 3                  Dakarai Mcglocklin

## 2022-07-22 NOTE — Anesthesia Preprocedure Evaluation (Signed)
Anesthesia Evaluation  Patient identified by MRN, date of birth, ID band Patient awake    Reviewed: Allergy & Precautions, H&P , NPO status , Patient's Chart, lab work & pertinent test results  Airway Mallampati: II  TM Distance: >3 FB Neck ROM: Full    Dental  (+) Teeth Intact, Dental Advisory Given   Pulmonary neg pulmonary ROS   breath sounds clear to auscultation       Cardiovascular negative cardio ROS  Rhythm:Regular     Neuro/Psych negative neurological ROS  negative psych ROS   GI/Hepatic negative GI ROS, Neg liver ROS,,,  Endo/Other  negative endocrine ROS    Renal/GU negative Renal ROS     Musculoskeletal  RIGHT LATERAL MENISCUS TEAR   Abdominal   Peds  Hematology negative hematology ROS (+)   Anesthesia Other Findings   Reproductive/Obstetrics negative OB ROS                             Anesthesia Physical Anesthesia Plan  ASA: 1  Anesthesia Plan: General   Post-op Pain Management: Tylenol PO (pre-op)* and Gabapentin PO (pre-op)*   Induction: Intravenous  PONV Risk Score and Plan: 3 and Ondansetron, Dexamethasone and Midazolam  Airway Management Planned: LMA  Additional Equipment: None  Intra-op Plan:   Post-operative Plan: Extubation in OR  Informed Consent: I have reviewed the patients History and Physical, chart, labs and discussed the procedure including the risks, benefits and alternatives for the proposed anesthesia with the patient or authorized representative who has indicated his/her understanding and acceptance.     Dental advisory given  Plan Discussed with: CRNA  Anesthesia Plan Comments: (Will discuss regional with surgeon when arrives and perform in OR if requested)       Anesthesia Quick Evaluation

## 2022-07-22 NOTE — Transfer of Care (Signed)
Immediate Anesthesia Transfer of Care Note  Patient: Jeanette Crosby  Procedure(s) Performed: RIGHT KNEE ARTHROSCOPY WITH POSSIBLE LATERAL MENISCAL REPAIR (Right: Knee)  Patient Location: PACU  Anesthesia Type:General  Level of Consciousness: sedated  Airway & Oxygen Therapy: Patient Spontanous Breathing and Patient connected to face mask oxygen  Post-op Assessment: Report given to RN and Post -op Vital signs reviewed and stable  Post vital signs: Reviewed and stable  Last Vitals:  Vitals Value Taken Time  BP 102/62 07/22/22 1223  Temp    Pulse 56 07/22/22 1224  Resp 16 07/22/22 1224  SpO2 100 % 07/22/22 1224  Vitals shown include unvalidated device data.  Last Pain:  Vitals:   07/22/22 1012  TempSrc: Oral  PainSc: 0-No pain      Patients Stated Pain Goal: 3 (05/02/10 1735)  Complications: No notable events documented.

## 2022-07-22 NOTE — Interval H&P Note (Signed)
History and Physical Interval Note:  07/22/2022 11:13 AM  Jeanette Crosby  has presented today for surgery, with the diagnosis of RIGHT LATERAL MENISCUS TEAR.  The various methods of treatment have been discussed with the patient and family. After consideration of risks, benefits and other options for treatment, the patient has consented to  Procedure(s): RIGHT KNEE ARTHROSCOPY WITH POSSIBLE LATERAL MENISCAL REPAIR (Right) as a surgical intervention.  The patient's history has been reviewed, patient examined, no change in status, stable for surgery.  I have reviewed the patient's chart and labs.  Questions were answered to the patient's satisfaction.     Vanetta Mulders

## 2022-07-23 ENCOUNTER — Encounter (HOSPITAL_BASED_OUTPATIENT_CLINIC_OR_DEPARTMENT_OTHER): Payer: Self-pay | Admitting: Orthopaedic Surgery

## 2022-08-02 ENCOUNTER — Ambulatory Visit (INDEPENDENT_AMBULATORY_CARE_PROVIDER_SITE_OTHER): Payer: PRIVATE HEALTH INSURANCE | Admitting: Orthopaedic Surgery

## 2022-08-02 DIAGNOSIS — S83261A Peripheral tear of lateral meniscus, current injury, right knee, initial encounter: Secondary | ICD-10-CM

## 2022-08-02 NOTE — Progress Notes (Signed)
Post Operative Evaluation    Procedure/Date of Surgery: Right knee plica excision with medial meniscal repair 07/22/22  Interval History:   Presents today 2 weeks status post above procedure.  Overall she is doing quite well.  She had quite significant pain initially although this is dramatically improved over the last several days.  She is experiencing some swelling and weakness in the right knee.  She has been compliant with brace   PMH/PSH/Family History/Social History/Meds/Allergies:    Past Medical History:  Diagnosis Date   Asthma    Seasonal allergies    Tonsillitis    Past Surgical History:  Procedure Laterality Date   KNEE ARTHROSCOPY WITH MENISCAL REPAIR Right 07/22/2022   Procedure: RIGHT KNEE ARTHROSCOPY WITH POSSIBLE MEDIAL MENISCAL REPAIR, PLICA EXCISION;  Surgeon: Vanetta Mulders, MD;  Location: [place];  Service: Orthopedics;  Laterality: Right;   TONSILLECTOMY     WISDOM TOOTH EXTRACTION     2020   Social History   Socioeconomic History   Marital status: Single    Spouse name: Not on file   Number of children: Not on file   Years of education: Not on file   Highest education level: Not on file  Occupational History   Not on file  Tobacco Use   Smoking status: Never   Smokeless tobacco: Never  Vaping Use   Vaping Use: Never used  Substance and Sexual Activity   Alcohol use: Never   Drug use: Never   Sexual activity: Not on file  Other Topics Concern   Not on file  Social History Narrative   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 No Known Allergies Current Outpatient Medications  Medication Sig Dispense Refill   albuterol (VENTOLIN HFA) 108 (90 Base) MCG/ACT  inhaler Inhale into the lungs every 6 (six) hours as needed for wheezing or shortness of breath.     aspirin EC 325 MG tablet Take 1 tablet (325 mg total) by mouth daily. 14 tablet 0   Multiple Vitamin (MULTIVITAMIN) capsule Take 1 capsule by mouth daily.     norgestimate-ethinyl estradiol (ORTHO-CYCLEN) 0.25-35 MG-MCG tablet Take 1 tablet by mouth daily.     oxyCODONE (ROXICODONE) 5 MG immediate release tablet Take 1 tablet (5 mg total) by mouth every 4 (four) hours as needed for severe pain or breakthrough pain. 5 tablet 0   No current facility-administered medications for this visit.   No results found.  Review of Systems:   A ROS was performed including pertinent positives and negatives as documented in the HPI.   Musculoskeletal Exam:   Right knee incisions are well-appearing without erythema or drainage.  There is some quad atrophy and decreased tone.  Range of motion is from 0 to 120 degrees.  There is trace effusion.  Negative Lachman.  No joint line tenderness. Imaging:      I personally reviewed and interpreted the radiographs.   Assessment:   2 weeks status post right knee plica excision medial meniscal repair.  Overall she is doing extremely well.  Today's  visit I would like her to advance her weightbearing and she may begin to work on range of motion as well as strengthening.  I will plan to see her back in training room in 4 weeks for reassessment  Plan :    -Return to training room in 4 weeks for reassessment      I personally saw and evaluated the patient, and participated in the management and treatment plan.  Vanetta Mulders, MD Attending Physician, Orthopedic Surgery  This document was dictated using Dragon voice recognition software. A reasonable attempt at proof reading has been made to minimize errors.

## 2022-08-29 ENCOUNTER — Other Ambulatory Visit: Payer: Self-pay | Admitting: Sports Medicine

## 2022-08-29 DIAGNOSIS — G8929 Other chronic pain: Secondary | ICD-10-CM

## 2022-08-29 DIAGNOSIS — S43431A Superior glenoid labrum lesion of right shoulder, initial encounter: Secondary | ICD-10-CM

## 2022-08-30 ENCOUNTER — Encounter: Payer: Self-pay | Admitting: Sports Medicine

## 2022-08-30 NOTE — Progress Notes (Signed)
  [employer] Training Room Note  History of Present Illness:   Jeanette Crosby is a 22 y.o. female who presents after right shoulder injury x 4 days.  She is a Tourist information centre manager.  Currently not playing, but getting back into conditioning in the weight room.  About 4 days ago she had some pain deep within the shoulder, then yesterday she was doing a push-up and she felt a sharp pop within the anterior aspect of the shoulder with associated pain.  Her pain has been rather significant since this time, 7-8/10.  She had a history of a SLAP lesion on her contralateral shoulder last year that was treated with intra-articular injection and physical therapy.  She has been told she is hypermobile in the past.  Currently taking ibuprofen.   Assessment and Plan:   Acute right shoulder injury - likely labral tear with SLAP lesion History of left shoulder SLAP lesion Generalized hypermobility  She will hold from throwing at this time, may take anti-inflammatories for pain control. Will obtain MRI arthrogram of the right shoulder for diagnostic evaluation.   Comprehensive Musculoskeletal Exam:    Right shoulder: Full active and passive range of motion in all directions albeit pain at endrange.  Positive O'Brien's, positive labral shear, mildly positive speeds and Hawkins impingement testing.  Rotator cuff testing shows 5/5 strength without gross weakness.  Imaging:    -Plan for MR arthrogram of the right shoulder  Elba Barman, DO [place]  This document was dictated using Dragon voice recognition software. A reasonable attempt at proof reading has been made to minimize errors.

## 2022-09-02 ENCOUNTER — Other Ambulatory Visit: Payer: Self-pay | Admitting: Sports Medicine

## 2022-09-02 DIAGNOSIS — S43431A Superior glenoid labrum lesion of right shoulder, initial encounter: Secondary | ICD-10-CM

## 2022-09-02 DIAGNOSIS — G8929 Other chronic pain: Secondary | ICD-10-CM

## 2022-09-25 ENCOUNTER — Ambulatory Visit
Admission: RE | Admit: 2022-09-25 | Discharge: 2022-09-25 | Disposition: A | Discharge status: Home / Self Care | Payer: PRIVATE HEALTH INSURANCE | Source: Ambulatory Visit | Attending: Sports Medicine | Admitting: Sports Medicine

## 2022-09-25 DIAGNOSIS — S43431A Superior glenoid labrum lesion of right shoulder, initial encounter: Secondary | ICD-10-CM

## 2022-09-25 DIAGNOSIS — G8929 Other chronic pain: Secondary | ICD-10-CM

## 2022-09-25 MED ORDER — IOPAMIDOL (ISOVUE-M 200) INJECTION 41%
15.0000 mL | Freq: Once | INTRAMUSCULAR | Status: AC
  Administered 2022-09-25: 15 mL via INTRA_ARTICULAR

## 2022-09-30 ENCOUNTER — Encounter (HOSPITAL_BASED_OUTPATIENT_CLINIC_OR_DEPARTMENT_OTHER): Payer: Self-pay | Admitting: Orthopaedic Surgery

## 2022-09-30 NOTE — Progress Notes (Signed)
Chief Complaint: Right shoulder     History of Present Illness:    Jeanette Crosby is a 22 y.o. 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  She is having a very difficult time with overhead activity and any type of overhead activity.  She is experiencing the pain deep in the shoulder.  This is her dominant arm.  This has been limiting her ability to play.  She overall has been doing quite well from a knee perspective.  She does not have any pain in this.    Surgical History:   None with regard to right shoulder  PMH/PSH/Family History/Social History/Meds/Allergies:    Past Medical History:  Diagnosis Date   Asthma    Seasonal allergies    Tonsillitis    Past Surgical History:  Procedure Laterality Date   KNEE ARTHROSCOPY WITH MENISCAL REPAIR Right 07/22/2022   Procedure: RIGHT KNEE ARTHROSCOPY WITH POSSIBLE MEDIAL MENISCAL REPAIR, PLICA EXCISION;  Surgeon: Vanetta Mulders, MD;  Location: [place];  Service: Orthopedics;  Laterality: Right;   TONSILLECTOMY     WISDOM TOOTH EXTRACTION     2020   Social History   Socioeconomic History   Marital status: Single    Spouse name: Not on file   Number of children: Not on file   Years of education: Not on file   Highest education level: Not on file  Occupational History   Not on file  Tobacco Use   Smoking status: Never   Smokeless tobacco: Never  Vaping Use   Vaping Use: Never used  Substance and Sexual Activity   Alcohol use: Never   Drug use: Never   Sexual activity: Not on file  Other Topics Concern   Not on file  Social History Narrative   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 No Known Allergies Current Outpatient Medications  Medication Sig Dispense Refill   albuterol (VENTOLIN HFA) 108 (90 Base) MCG/ACT inhaler Inhale into the lungs every 6 (six) hours as needed for wheezing or shortness of breath.     aspirin EC 325 MG tablet Take 1 tablet (325 mg total) by mouth daily. 14 tablet 0   Multiple Vitamin (MULTIVITAMIN) capsule Take 1 capsule by mouth daily.     norgestimate-ethinyl estradiol (ORTHO-CYCLEN) 0.25-35 MG-MCG tablet Take 1 tablet by mouth daily.     oxyCODONE (ROXICODONE) 5 MG immediate release tablet Take 1 tablet (5 mg total) by mouth every 4 (four) hours as needed for severe pain or breakthrough pain. 5 tablet 0   No current facility-administered medications for this visit.   No results found.  Review of Systems:   A ROS was performed including pertinent positives and negatives as documented in the HPI.  Physical Exam :   Constitutional: NAD and appears stated age Neurological: Alert and oriented Psych: Appropriate affect and cooperative Last menstrual period 09/25/2022.   Comprehensive Musculoskeletal Exam:    Range of motion is from 0 to 170 degrees forward elevation external rotation of the side is  to 80 degrees internal rotation is to T12.  Negative belly press.  5 out of 5 strength with forward elevation.  There is positive O'Brien maneuver with cross body abduction  Imaging:    MRI (right shoulder): Tearing of the posterior superior labrum consistent with a peelback SLAP type injury  I personally reviewed and interpreted the radiographs.   Assessment:   22 y.o. female with right shoulder superior labral injury.  I did describe that overall I do believe this may have been flared up by a period of time on crutches needing to rehab the knee.  That effect I do believe that a period of strengthening with or without an additional intra-articular injection would significantly help her and allow her to rehab the shoulder.  I do  believe that this is the best initial place to start.  I will plan to see her in office for right shoulder ultrasound-guided glenohumeral injection as well as she will begin strengthening the right shoulder  Plan :    -Plan for office visit for right shoulder ultrasound-guided injection as well as a dynamic shoulder strengthening program     I personally saw and evaluated the patient, and participated in the management and treatment plan.  Vanetta Mulders, MD Attending Physician, Orthopedic Surgery  This document was dictated using Dragon voice recognition software. A reasonable attempt at proof reading has been made to minimize errors.

## 2022-10-03 ENCOUNTER — Encounter: Payer: Self-pay | Admitting: Sports Medicine

## 2022-10-03 NOTE — Progress Notes (Signed)
  [employer] Training Room Note  History of Present Illness:   Jeanette Crosby is a 22 y.o. female who presents for follow-up of right shoulder injury.  She is a Animator.  Saw me initially for right shoulder injury with concern for labral tear.  MRI did confirm a posterior superior labral tear with a small degree of peelback SLAP injury.  She is still having some pain, although is improving since she is not doing activity.  She did see my partner, Dr. Sammuel Hines, who recommended trialing physical therapy and conservative care first, as opposed to surgical repair.  She appreciates this recommendation and is here simply for my additional opinion.  Assessment and Plan:   Right shoulder posterior superior labral tear -discussed all treatment options for her labral tear.  I do agree with my partner that I think a trial of consistent physical therapy and rehab should be trialed first versus surgery.  She has had previous corticosteroid injections, do think that a PRP injection would be more advantageous and less caustic to the joint.  She will discuss with Dr. Sammuel Hines in a timeframe if she is not getting better with conservative management, when surgery could be considered.  She will let me know after she discusses this with family and staff.  Will let me and Dr. Sammuel Hines know whether she would like to proceed with corticosteroid or PRP injection in the interim.  Comprehensive Musculoskeletal Exam:    Unchanged from my previous examination  Imaging:    MR SHOULDER RIGHT W CONTRAST  Glenohumeral Joint: Intraarticular contrast distending the joint capsule. Normal glenohumeral ligaments. No chondral defect.  Labrum: Posterosuperior labral tear.  Bones: No fracture or dislocation. No aggressive osseous lesion.  Other: No fluid collection or hematoma.  IMPRESSION: 1. Posterosuperior labral tear. 2. No rotator tear.  No acute osseous injury of the right  shoulder. __________________  Jeanette Barman, DO [place]  This document was dictated using Dragon voice recognition software. A reasonable attempt at proof reading has been made to minimize errors.

## 2022-10-11 ENCOUNTER — Ambulatory Visit (INDEPENDENT_AMBULATORY_CARE_PROVIDER_SITE_OTHER): Payer: PRIVATE HEALTH INSURANCE | Admitting: Orthopaedic Surgery

## 2022-10-11 DIAGNOSIS — M25511 Pain in right shoulder: Secondary | ICD-10-CM

## 2022-10-11 DIAGNOSIS — S43431A Superior glenoid labrum lesion of right shoulder, initial encounter: Secondary | ICD-10-CM

## 2022-10-11 MED ORDER — TRIAMCINOLONE ACETONIDE 40 MG/ML IJ SUSP
80.0000 mg | INTRAMUSCULAR | Status: AC | PRN
  Administered 2022-10-11: 80 mg via INTRA_ARTICULAR

## 2022-10-11 MED ORDER — LIDOCAINE HCL 1 % IJ SOLN
4.0000 mL | INTRAMUSCULAR | Status: AC | PRN
  Administered 2022-10-11: 4 mL

## 2022-10-11 NOTE — Progress Notes (Signed)
Chief Complaint: Right shoulder     History of Present Illness:   10/11/2022: Presents today here for right shoulder injection.  Maurisha Mongeau is a 22 y.o. 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  She is having a very difficult time with overhead activity and any type of overhead activity.  She is experiencing the pain deep in the shoulder.  This is her dominant arm.  This has been limiting her ability to play.  She overall has been doing quite well from a knee perspective.  She does not have any pain in this.    Surgical History:   None with regard to right shoulder  PMH/PSH/Family History/Social History/Meds/Allergies:    Past Medical History:  Diagnosis Date  . Asthma   . Seasonal allergies   . Tonsillitis    Past Surgical History:  Procedure Laterality Date  . KNEE ARTHROSCOPY WITH MENISCAL REPAIR Right 07/22/2022   Procedure: RIGHT KNEE ARTHROSCOPY WITH POSSIBLE MEDIAL MENISCAL REPAIR, PLICA EXCISION;  Surgeon: Huel Cote, MD;  Location: [facility];  Service: Orthopedics;  Laterality: Right;  . TONSILLECTOMY    . WISDOM TOOTH EXTRACTION     2020   Social History   Socioeconomic History  . Marital status: Single    Spouse name: Not on file  . Number of children: Not on file  . Years of education: Not on file  . Highest education level: Not on file  Occupational History  . Not on file  Tobacco Use  . Smoking status: Never  . Smokeless tobacco: Never  Vaping Use  . Vaping Use: Never used  Substance and Sexual Activity  . Alcohol use: Never  . Drug use: Never  . Sexual activity: Not on file  Other Topics Concern  . Not on file  Social History Narrative   Consulting civil engineer at [employer]   Financial Resource Strain: Not on file  Food Insecurity: Not on file  Transportation Needs: Not on file  Physical Activity: Not on  file  Stress: Not on file  Social Connections: Not on file   No family history on file. No Known Allergies Current Outpatient Medications  Medication Sig Dispense Refill  . albuterol (VENTOLIN HFA) 108 (90 Base) MCG/ACT inhaler Inhale into the lungs every 6 (six) hours as needed for wheezing or shortness of breath.    Marland Kitchen aspirin EC 325 MG tablet Take 1 tablet (325 mg total) by mouth daily. 14 tablet 0  . Multiple Vitamin (MULTIVITAMIN) capsule Take 1 capsule by mouth daily.    . norgestimate-ethinyl estradiol (ORTHO-CYCLEN) 0.25-35 MG-MCG tablet Take 1 tablet by mouth daily.    Marland Kitchen oxyCODONE (ROXICODONE) 5 MG immediate release tablet Take 1 tablet (5 mg total) by mouth every 4 (four) hours as needed for severe pain or breakthrough pain. 5 tablet 0   No current facility-administered medications for this visit.   No results found.  Review of Systems:   A ROS was performed including pertinent positives and negatives as documented in the HPI.  Physical Exam :   Constitutional: NAD and appears stated age Neurological: Alert and oriented Psych: Appropriate affect and cooperative Last menstrual period 09/25/2022.   Comprehensive Musculoskeletal Exam:    Range of motion is from 0 to 170 degrees  forward elevation external rotation of the side is to 80 degrees internal rotation is to T12.  Negative belly press.  5 out of 5 strength with forward elevation.  There is positive O'Brien maneuver with cross body abduction  Imaging:    MRI (right shoulder): Tearing of the posterior superior labrum consistent with a peelback SLAP type injury  I personally reviewed and interpreted the radiographs.   Assessment:   22 y.o. female with right shoulder superior labral injury.  I did describe that overall I do believe this may have been flared up by a period of time on crutches needing to rehab the knee.  That effect I do believe that a period of strengthening with or without an additional  intra-articular injection would significantly help her and allow her to rehab the shoulder.  I do believe that this is the best initial place to start.  She would like to proceed with this today. Plan :    -Right shoulder ultrasound-guided injection performed after verbal consent obtained     Procedure Note  Patient: Jeanette Crosby             Date of Birth: 2001-05-12           MRN: [id number]             Visit Date: 10/11/2022  Procedures: Visit Diagnoses:  1. Labral tear of shoulder, right, initial encounter     Large Joint Inj: R glenohumeral on 10/11/2022 8:43 AM Indications: pain Details: 22 G 1.5 in needle, ultrasound-guided anterior approach  Arthrogram: No  Medications: 4 mL lidocaine 1 %; 80 mg triamcinolone acetonide 40 MG/ML Outcome: tolerated well, no immediate complications Procedure, treatment alternatives, risks and benefits explained, specific risks discussed. Consent was given by the patient. Immediately prior to procedure a time out was called to verify the correct patient, procedure, equipment, support staff and site/side marked as required. Patient was prepped and draped in the usual sterile fashion.           I personally saw and evaluated the patient, and participated in the management and treatment plan.  Huel Cote, MD Attending Physician, Orthopedic Surgery  This document was dictated using Dragon voice recognition software. A reasonable attempt at proof reading has been made to minimize errors.

## 2022-11-05 ENCOUNTER — Telehealth (HOSPITAL_BASED_OUTPATIENT_CLINIC_OR_DEPARTMENT_OTHER): Payer: Self-pay | Admitting: Orthopaedic Surgery

## 2022-11-05 NOTE — Telephone Encounter (Signed)
I called and they stated her DOB isnt on the referral. Is there anyway we can resend it with the DOB?

## 2022-11-05 NOTE — Telephone Encounter (Signed)
Patient stats the referral  that was sent over to her PT Dr needs more information on it like her DOB  and more information.  [facility] ([phone] phone)  [phone] fax) is the place where she is suppose to be going.

## 2022-11-07 ENCOUNTER — Other Ambulatory Visit (HOSPITAL_BASED_OUTPATIENT_CLINIC_OR_DEPARTMENT_OTHER): Payer: Self-pay | Admitting: Orthopaedic Surgery

## 2022-11-07 ENCOUNTER — Telehealth: Payer: Self-pay | Admitting: Orthopaedic Surgery

## 2022-11-07 DIAGNOSIS — S43431A Superior glenoid labrum lesion of right shoulder, initial encounter: Secondary | ICD-10-CM

## 2022-11-07 NOTE — Telephone Encounter (Signed)
Patient called advised the referral did not have her DOB and home address on it. Patient asked if the referral can be sent again explaining what type of therapy she is needing . The number to contact patient is [phone]

## 2022-11-12 ENCOUNTER — Telehealth: Payer: Self-pay | Admitting: Orthopaedic Surgery

## 2022-11-12 ENCOUNTER — Other Ambulatory Visit (HOSPITAL_BASED_OUTPATIENT_CLINIC_OR_DEPARTMENT_OTHER): Payer: Self-pay | Admitting: Orthopaedic Surgery

## 2022-11-12 DIAGNOSIS — S43431A Superior glenoid labrum lesion of right shoulder, initial encounter: Secondary | ICD-10-CM

## 2022-11-12 NOTE — Telephone Encounter (Signed)
Referral faxed

## 2022-11-12 NOTE — Telephone Encounter (Signed)
New referral has been placed. Patient's mom stated the referral needed to state: evaluate and treat  Can you please fax that referral to: [phone]

## 2022-11-12 NOTE — Telephone Encounter (Signed)
Please call patient about their referral --[phone]

## 2022-12-12 ENCOUNTER — Encounter (HOSPITAL_BASED_OUTPATIENT_CLINIC_OR_DEPARTMENT_OTHER): Payer: Self-pay | Admitting: Orthopaedic Surgery

## 2023-09-13 IMAGING — MR MR SHOULDER*L* W/O CM
5 series · 40 of 40 positions shown · non-contrast
Comparison: None.

CLINICAL DATA: Shoulder pain, rotator cuff disorder suspected, xray
done

EXAM:
MRI OF THE LEFT SHOULDER WITHOUT CONTRAST
TECHNIQUE: Multiplanar, multisequence MR imaging of the shoulder was performed.
No intravenous contrast was administered.

[Series 3: T2 fat-sat · axial · 4.0mm · 0.29mm/px · z∈[-81,+63]mm · 10 of 30 slices shown (1 of 3)]
[im 1/30]
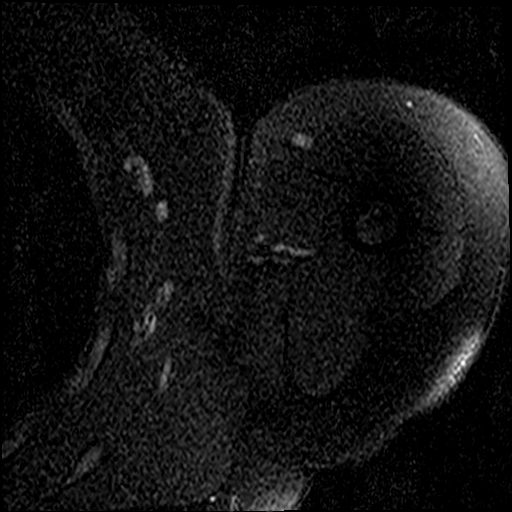
[im 4/30]
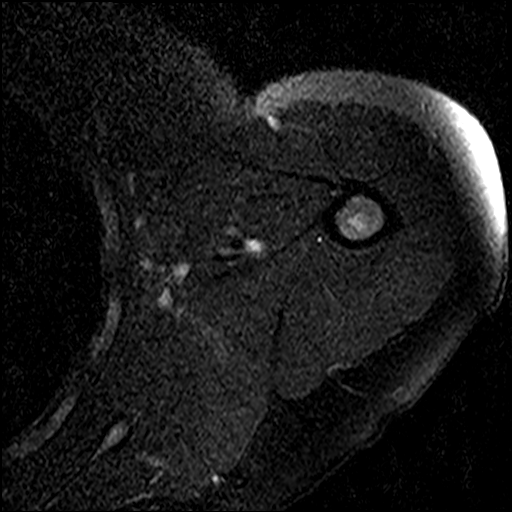
[im 7/30]
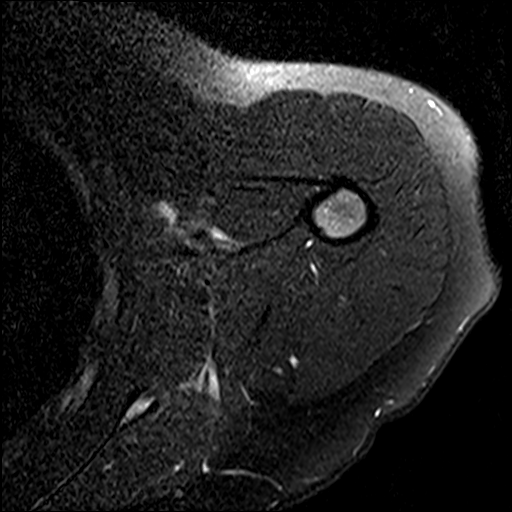
[im 10/30]
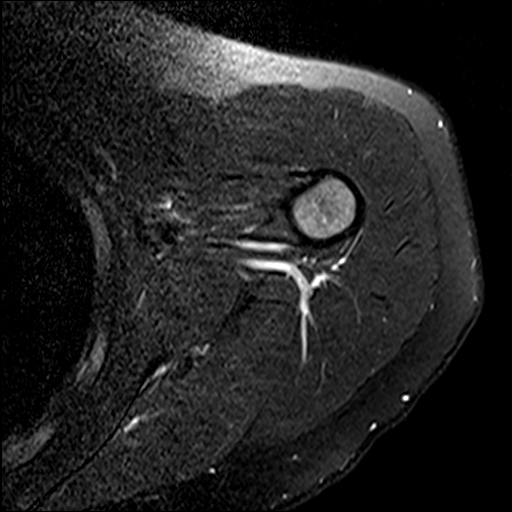
[im 13/30]
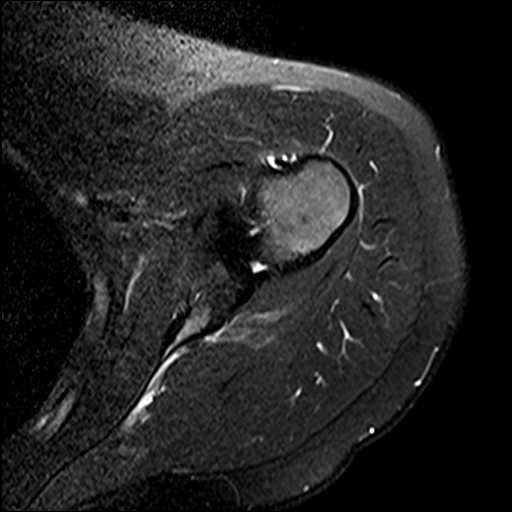
[im 17/30]
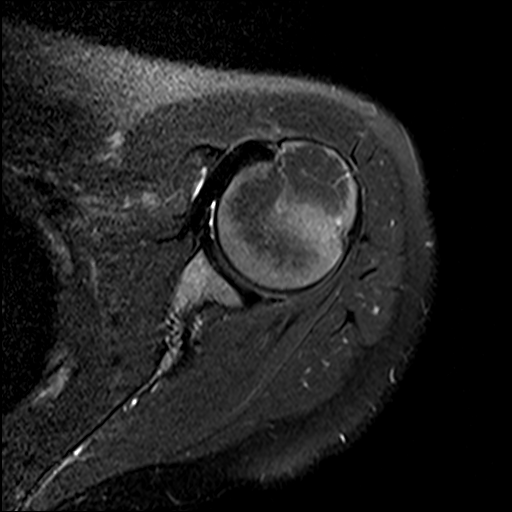
[im 20/30]
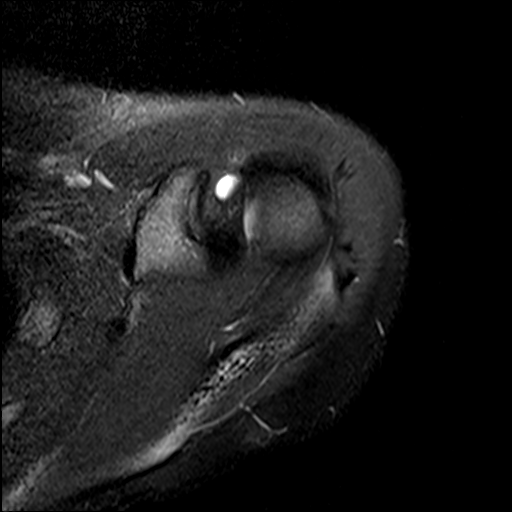
[im 23/30]
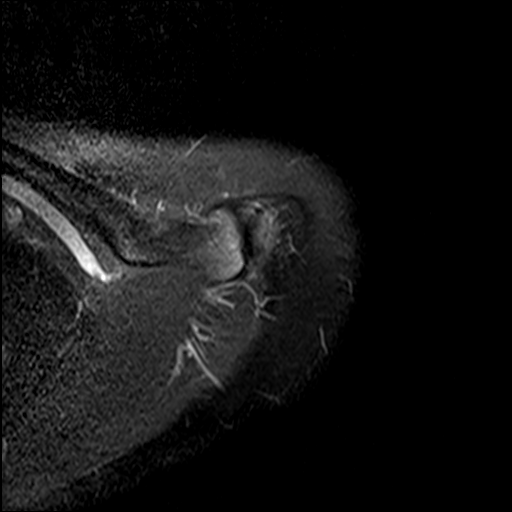
[im 26/30]
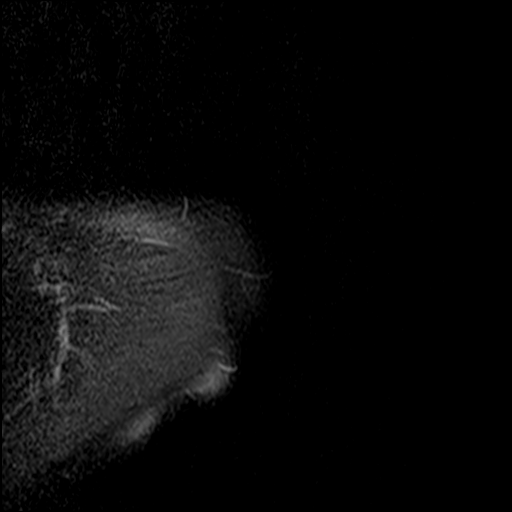
[im 30/30]
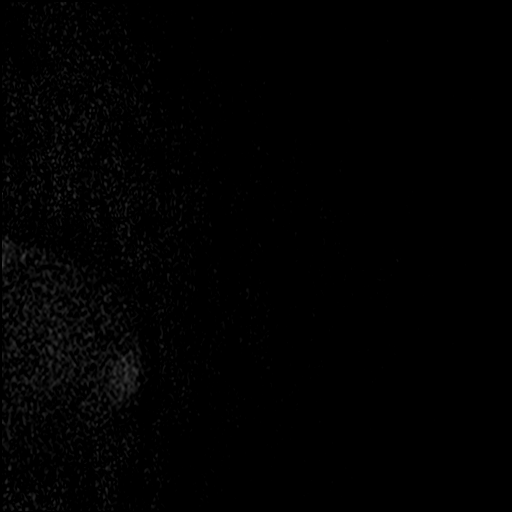

[Series 4: T2 fat-sat · oblique · 4.0mm · 0.59mm/px · 7 of 21 slices shown (2 of 3)]
[im 1/21]
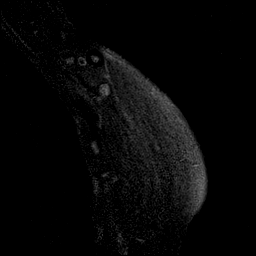
[im 4/21]
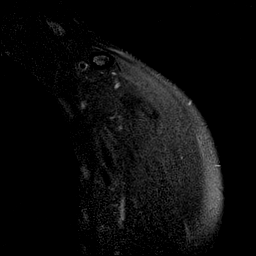
[im 7/21]
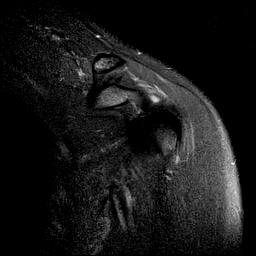
[im 11/21]
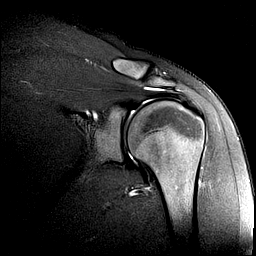
[im 14/21]
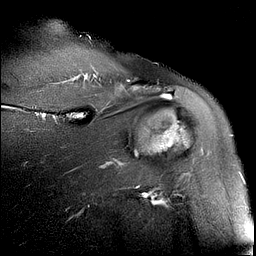
[im 17/21]
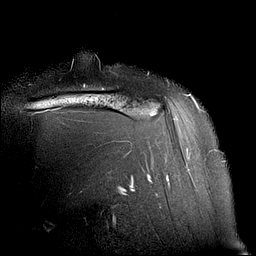
[im 21/21]
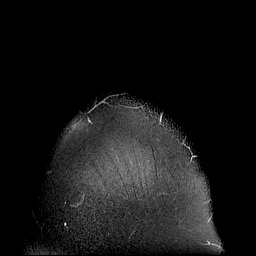

[Series 5: PD · oblique · 4.0mm · 0.59mm/px · 7 of 21 slices shown]
[im 1/21]
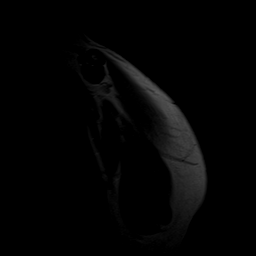
[im 4/21]
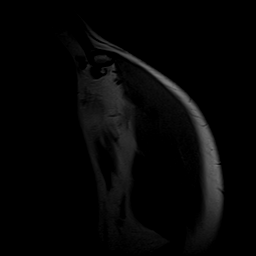
[im 7/21]
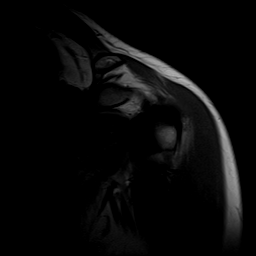
[im 11/21]
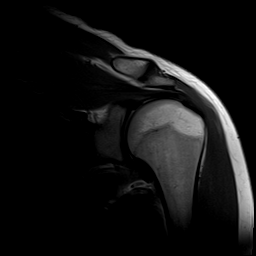
[im 14/21]
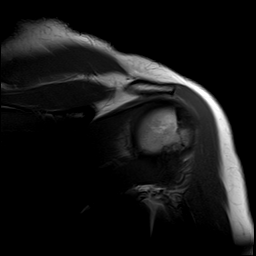
[im 17/21]
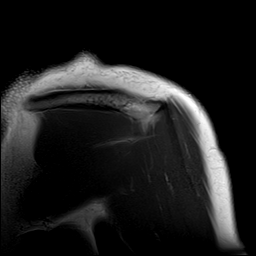
[im 21/21]
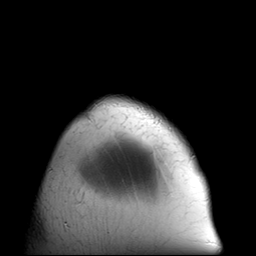

[Series 6: T2 fat-sat · oblique · 4.0mm · 0.59mm/px · 8 of 26 slices shown (3 of 3)]
[im 1/26]
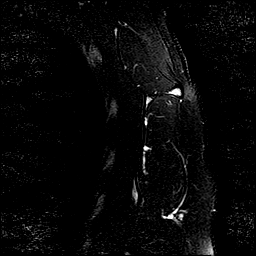
[im 4/26]
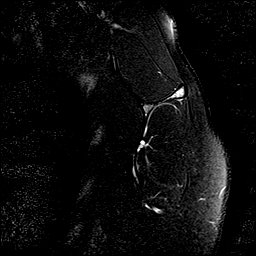
[im 8/26]
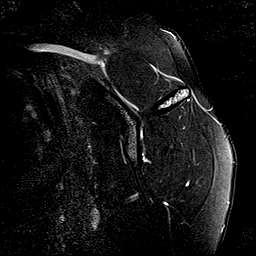
[im 11/26]
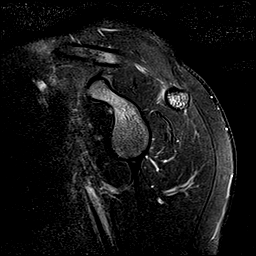
[im 15/26]
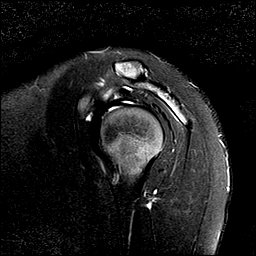
[im 18/26]
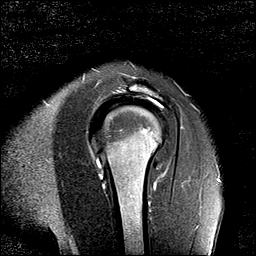
[im 22/26]
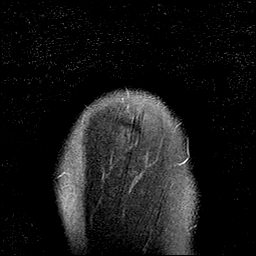
[im 26/26]
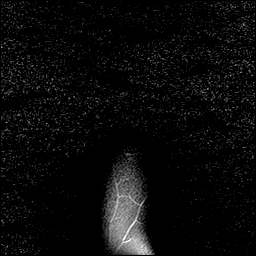

[Series 7: T1 · oblique · 4.0mm · 0.59mm/px · 8 of 26 slices shown]
[im 1/26]
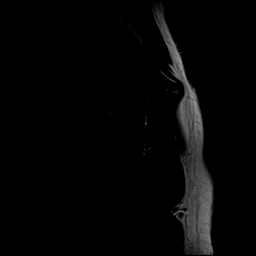
[im 4/26]
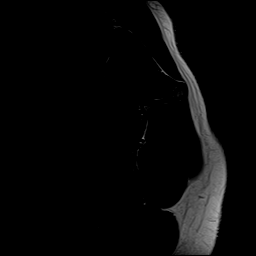
[im 8/26]
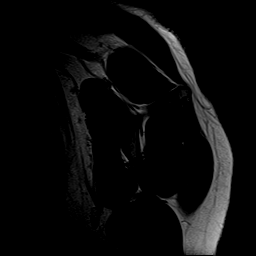
[im 11/26]
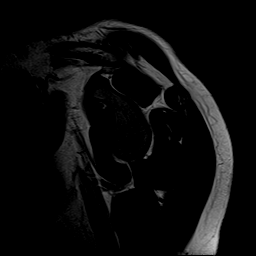
[im 15/26]
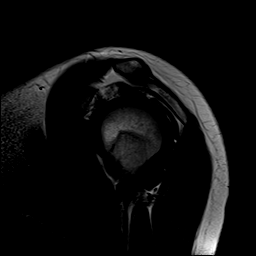
[im 18/26]
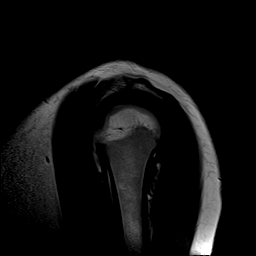
[im 22/26]
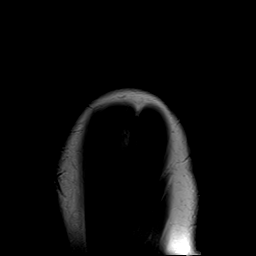
[im 26/26]
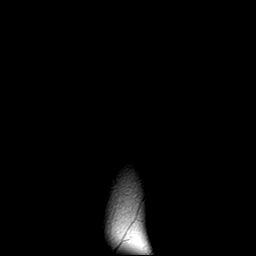

[40 of 40 positions shown; findings below may reference images not displayed]

FINDINGS: Rotator cuff: The supraspinatus and infraspinatus tendons are
intact. Teres minor tendon is intact. Subscapularis tendon is
intact.

Muscles: No significant muscle atrophy.

Biceps Long Head: Intraarticular and extraarticular portions of the
biceps tendon are intact.

Acromioclavicular Joint: No significant arthropathy of the
acromioclavicular joint. Small amount subacromial/subdeltoid bursal
fluid.

Glenohumeral Joint: No significant joint effusion. No chondral
defect.

Labrum: There is possible slight undercutting of the superior labrum
from 11 o'clock-8 o'clock (axial T2 image 12), though the edges
appear smooth in this could represent a normal variant.

Bones: No fracture or dislocation. No aggressive osseous lesion.

Other: No fluid collection or hematoma.
IMPRESSION: Possible slight undercutting of the superior labrum from [DATE],
which could indicate a SLAP tear, though edges appear smooth and
this could represent a normal variant. If there are supportive
physical exam findings, repeat exam with intra-articular contrast
(MR arthrogram) could be considered.

Mild distension of the subacromial-subdeltoid bursa.

Intact rotator cuff.  Intact long head biceps tendon.
